# Patient Record
Sex: Female | Born: 1960 | Hispanic: No | Marital: Single | Smoking: Former smoker
Health system: Southern US, Community
[De-identification: ages and names within clinical notes are randomized; demographics above are authoritative.]

## PROBLEM LIST (undated history)

## (undated) DIAGNOSIS — C801 Malignant (primary) neoplasm, unspecified: Secondary | ICD-10-CM

## (undated) DIAGNOSIS — F329 Major depressive disorder, single episode, unspecified: Secondary | ICD-10-CM

## (undated) DIAGNOSIS — J45909 Unspecified asthma, uncomplicated: Secondary | ICD-10-CM

## (undated) DIAGNOSIS — F32A Depression, unspecified: Secondary | ICD-10-CM

## (undated) DIAGNOSIS — F319 Bipolar disorder, unspecified: Secondary | ICD-10-CM

## (undated) HISTORY — PX: OVARIAN CYST REMOVAL: SHX89

## (undated) HISTORY — PX: KNEE ARTHROSCOPY W/ ACL RECONSTRUCTION: SHX1858

---

## 1998-04-29 ENCOUNTER — Other Ambulatory Visit: Admission: RE | Admit: 1998-04-29 | Discharge: 1998-04-29 | Payer: Self-pay | Admitting: Obstetrics and Gynecology

## 1999-02-17 ENCOUNTER — Other Ambulatory Visit: Admission: RE | Admit: 1999-02-17 | Discharge: 1999-02-17 | Payer: Self-pay | Admitting: Obstetrics and Gynecology

## 2016-03-28 ENCOUNTER — Encounter: Payer: Self-pay | Admitting: Hematology and Oncology

## 2016-03-28 ENCOUNTER — Telehealth: Payer: Self-pay | Admitting: Hematology and Oncology

## 2016-03-28 NOTE — Telephone Encounter (Signed)
Appointment scheduled with Dr. Lindi Adie for 7/10 at 9:30 per Coralyn Mark for a second opinion. Patient agreed. Demographics verified and location given to the patient. Letter to referring.

## 2016-03-31 ENCOUNTER — Telehealth: Payer: Self-pay | Admitting: *Deleted

## 2016-03-31 NOTE — Telephone Encounter (Signed)
Received appt date/time from Andrea.  Placed a note for an intake form to be given to the pt at time of check in. 

## 2016-04-04 ENCOUNTER — Encounter: Payer: Self-pay | Admitting: Hematology and Oncology

## 2016-04-04 ENCOUNTER — Other Ambulatory Visit: Payer: Self-pay | Admitting: Hematology and Oncology

## 2016-04-04 ENCOUNTER — Ambulatory Visit
Admission: RE | Admit: 2016-04-04 | Discharge: 2016-04-04 | Disposition: A | Discharge status: Home / Self Care | Payer: PRIVATE HEALTH INSURANCE | Source: Ambulatory Visit | Attending: Hematology and Oncology | Admitting: Hematology and Oncology

## 2016-04-04 ENCOUNTER — Telehealth: Payer: Self-pay | Admitting: Hematology and Oncology

## 2016-04-04 ENCOUNTER — Ambulatory Visit (HOSPITAL_BASED_OUTPATIENT_CLINIC_OR_DEPARTMENT_OTHER): Payer: PRIVATE HEALTH INSURANCE | Admitting: Hematology and Oncology

## 2016-04-04 VITALS — BP 113/74 | HR 72 | Temp 98.2°F | Resp 18 | Ht 74.5 in | Wt 133.9 lb

## 2016-04-04 DIAGNOSIS — N6320 Unspecified lump in the left breast, unspecified quadrant: Secondary | ICD-10-CM

## 2016-04-04 DIAGNOSIS — C50212 Malignant neoplasm of upper-inner quadrant of left female breast: Secondary | ICD-10-CM | POA: Diagnosis not present

## 2016-04-04 DIAGNOSIS — C773 Secondary and unspecified malignant neoplasm of axilla and upper limb lymph nodes: Secondary | ICD-10-CM

## 2016-04-04 NOTE — Assessment & Plan Note (Signed)
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 After reviewing the pathology in detail, we proceeded to discuss the different treatment options between surgery, radiation, chemotherapy, antiestrogen therapies.  Recommendation: 1. Neoadjuvant chemotherapy with TCH Perjeta 6 cycles followed by Herceptin maintenance for 1 year 2. Followed by breast conserving surgery+ AXLND vs clinical trial ALLIANCE [id number] (Axlnd vs Sln alone depending on response to treatment) 3. Followed by adjuvant radiation therapy 4. Followed by adjuvant antiestrogen therapy  Chemotherapy Counseling: I discussed the risks and benefits of chemotherapy including the risks of nausea/ vomiting, risk of infection from low WBC count, fatigue due to chemo or anemia, bruising or bleeding due to low platelets, mouth sores, loss/ change in taste and decreased appetite. Liver and kidney function will be monitored through out chemotherapy as abnormalities in liver and kidney function may be a side effect of treatment. Cardiac dysfunction due to Herceptin and Perjeta were discussed in detail. Risk of permanent bone marrow dysfunction due to chemo were also discussed.  Plan: 1. Port placement 2. Echocardiogram 3. Chemotherapy class 4. Breast MRI

## 2016-04-04 NOTE — Progress Notes (Signed)
[facility] CONSULT NOTE  No care team member to display  CHIEF COMPLAINTS/PURPOSE OF CONSULTATION:  Newly diagnosed breast cancer  HISTORY OF PRESENTING ILLNESS:  Marie Chang 55 y.o. female is here because of recent diagnosis of left breast cancer. Patient had a screening mammogram for detection abnormalities in the left breast. She subsequently underwent diagnostic mammograms and ultrasound and ultrasound-guided biopsy. 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 She also had enlarged axillary lymph nodes and lymph node biopsy was performed which revealed invasive ductal carcinoma. She decided to get a second opinion regarding her diagnosis and is here accompanied by her mother to discuss the results. She reports no major pain or discomfort after the biopsies. One of her main issues was that she is not confident about the biopsies of her performed and would like the investigations to be repeated along with diagnostic biopsies.  I reviewed her records extensively and collaborated the history with the patient.  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   MEDICAL HISTORY:  Depression, anxiety SURGICAL HISTORY: Cesarean section, ovarian cyst surgery SOCIAL HISTORY: Previous smoker smoked half a pack a day for 10 years quit 10 years ago, drinks 3 glasses of wine a week, denies any recreational drug use FAMILY  HISTORY: Father had prostate cancer and maternal uncle had prostate and colon cancers ALLERGIES:  Codeine causes itching, some antibiotics causes rash on torso MEDICATIONS: Sertraline once daily, Depakote 750 mg once daily REVIEW OF SYSTEMS:   Constitutional: Denies fevers, chills or abnormal night sweats Eyes: Denies blurriness of vision, double vision or watery eyes Ears, nose, mouth, throat, and face: Denies mucositis or sore throat Respiratory: Denies cough, dyspnea or wheezes Cardiovascular: Denies palpitation, chest discomfort or lower extremity swelling Gastrointestinal:  Denies nausea, heartburn or change in bowel habits Skin: Denies abnormal skin rashes Lymphatics: Denies new lymphadenopathy or easy bruising Neurological:Denies numbness, tingling or new weaknesses Behavioral/Psych: Anxious  Breast:  Denies any palpable lumps or discharge All other systems were reviewed with the patient and are negative.  PHYSICAL EXAMINATION: ECOG PERFORMANCE STATUS: 1 - Symptomatic but completely ambulatory  Filed Vitals:   04/04/16 0939  BP: 113/74  Pulse: 72  Temp: 98.2 F (36.8 C)  Resp: 18   Filed Weights   04/04/16 0939  Weight: 133 lb 14.4 oz (60.737 kg)    GENERAL:alert, no distress and comfortable SKIN: skin color, texture, turgor are normal, no rashes or significant lesions EYES: normal, conjunctiva are pink and non-injected, sclera clear OROPHARYNX:no exudate, no erythema and lips, buccal mucosa, and tongue normal  NECK: supple, thyroid normal size, non-tender, without nodularity LYMPH:  no palpable lymphadenopathy in the cervical, axillary or inguinal LUNGS: clear to auscultation and percussion with normal breathing effort HEART: regular rate & rhythm and no murmurs and no lower extremity edema ABDOMEN:abdomen soft, non-tender and normal bowel sounds Musculoskeletal:no cyanosis of digits and no clubbing  PSYCH: alert & oriented x 3 with fluent speech NEURO:  no focal  motor/sensory deficits Breast exam: No palpable lumps in the breasts. There are palpable axillary lymph nodes.  LABORATORY DATA:  RADIOGRAPHIC STUDIES: I have personally reviewed the radiological reports and agreed with the findings in the report.  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 After reviewing the pathology in detail, we proceeded to discuss the different treatment options between surgery, radiation, chemotherapy, antiestrogen therapies.  Recommendation: 1. Neoadjuvant chemotherapy with TCH Perjeta 6 cycles followed by Herceptin maintenance for 1 year 2. Followed by breast conserving surgery+ AXLND vs clinical trial ALLIANCE [id number] (Axlnd vs Sln alone depending on response to treatment) 3. Followed by adjuvant radiation therapy 4. Followed by adjuvant antiestrogen therapy  Chemotherapy Counseling: I discussed the risks and benefits of chemotherapy including the risks of nausea/ vomiting, risk of infection from low WBC count, fatigue due to chemo or anemia, bruising or bleeding due to low platelets, mouth sores, loss/ change in taste and decreased appetite. Liver and kidney function will be monitored through out chemotherapy as abnormalities in liver and kidney function may be a side effect of treatment.  Cardiac dysfunction due to Herceptin and Perjeta were discussed in detail. Risk of permanent bone marrow dysfunction due to chemo were also discussed.  DISCUSSION: Patient is not convinced with the biopsy or imaging results that she had already performed. I discussed with her that we can have our pathologist review the pathology report. However patient is only interested in repeat imaging and reviewed biopsying her breast lesions. I discussed with her that insurance may or may not authorize this. I arranged consultation with our breast radiologist at the breast center so that they can discuss these issues with her. After consultation they agreed to perform ultrasounds and biopsies.  If her triple-positive disease diagnosis is confirmed, based upon multiple positive lymph nodes, I recommend the above treatment plan to be implemented. If she were to agree, I'll plan to do the following  Plan: 1. Port placement 2. Echocardiogram 3. Chemotherapy class 4. Breast MRI (to evaluate extent of disease and to plan any additional biopsies) 5. CT chest abdomen pelvis and bone scan because of multiple enlarged lymph nodes in her complaints of gastric distention and bloating.  All questions were answered. The patient knows to call the clinic with any problems, questions or concerns.    Rulon Eisenmenger, MD 04/04/2016

## 2016-04-04 NOTE — Telephone Encounter (Signed)
appt made and avs printed °

## 2016-04-05 ENCOUNTER — Other Ambulatory Visit: Payer: Self-pay

## 2016-04-05 DIAGNOSIS — C50212 Malignant neoplasm of upper-inner quadrant of left female breast: Secondary | ICD-10-CM

## 2016-04-05 NOTE — Addendum Note (Signed)
Addended by: Cheree Ditto on: 04/05/2016 03:14 PM   Modules accepted: Medications

## 2016-04-05 NOTE — Progress Notes (Signed)
Received VM from pt's mother requesting date and time of scheduled MRI as discussed in MD appt on 04/04/16.  Order entered per Dr. Lindi Adie and GI notified of need of appointment.  GI scheduler stated she would contact pt with time of appointment on 04/12/16.  Called pt's mother back in regards to this information.  No further questions or concerns at time of call.

## 2016-04-06 ENCOUNTER — Telehealth: Payer: Self-pay

## 2016-04-06 NOTE — Telephone Encounter (Signed)
Received VM from pt's mother with several questions regarding daughter's plan of care moving forward.  Returned call and discussed.  I explained to Jana Half that Sharica is scheduled for an MRI on 7/18 and then will have a follow up appointment to discuss all details of plan on 7/19 with Dr. Lindi Adie.  Jana Half wished to confirm order of events moving forward in regards to her daughter's treatment.  I discussed that Aniylah would start with the MRI, followed by chemotherapy, followed by surgery, and then would have radiation therapy.  Jana Half confused as she thought radiation would be first and also because Shatarra received call from surgeon's office.  I reiterated this was a consultation in regards to the referral placed to Dr. Cristal Generous office.  I reiterated that Dr. Lindi Adie would have details and plan moving forward at follow up appointment next week once MRI was obtained.  Reviewed upcoming appointment dates and times and Jana Half without further questions or concerns at time of call.

## 2016-04-11 ENCOUNTER — Telehealth: Payer: Self-pay

## 2016-04-11 NOTE — Telephone Encounter (Signed)
This RN returned call to pt mother re: MRI.  I advised her that MRI was declined by insurance because [facility] is out of network and to be covered she would need to go to the [facility] facility in [place], [place].  She stated pt would not return to the [facility] facility, and it is fine, she will pay for the scan for her daughter out of pocket.

## 2016-04-12 ENCOUNTER — Ambulatory Visit
Admission: RE | Admit: 2016-04-12 | Discharge: 2016-04-12 | Disposition: A | Discharge status: Home / Self Care | Payer: PRIVATE HEALTH INSURANCE | Source: Ambulatory Visit | Attending: Hematology and Oncology | Admitting: Hematology and Oncology

## 2016-04-12 DIAGNOSIS — C50212 Malignant neoplasm of upper-inner quadrant of left female breast: Secondary | ICD-10-CM

## 2016-04-12 MED ORDER — GADOBENATE DIMEGLUMINE 529 MG/ML IV SOLN
12.0000 mL | Freq: Once | INTRAVENOUS | Status: AC | PRN
  Administered 2016-04-12: 12 mL via INTRAVENOUS

## 2016-04-13 ENCOUNTER — Encounter: Payer: Self-pay | Admitting: Hematology and Oncology

## 2016-04-13 ENCOUNTER — Ambulatory Visit (HOSPITAL_BASED_OUTPATIENT_CLINIC_OR_DEPARTMENT_OTHER): Payer: PRIVATE HEALTH INSURANCE | Admitting: Hematology and Oncology

## 2016-04-13 ENCOUNTER — Other Ambulatory Visit: Payer: Self-pay | Admitting: *Deleted

## 2016-04-13 VITALS — BP 138/80 | HR 83 | Temp 98.4°F | Resp 18 | Wt 135.2 lb

## 2016-04-13 DIAGNOSIS — C50212 Malignant neoplasm of upper-inner quadrant of left female breast: Secondary | ICD-10-CM

## 2016-04-13 DIAGNOSIS — C773 Secondary and unspecified malignant neoplasm of axilla and upper limb lymph nodes: Secondary | ICD-10-CM | POA: Diagnosis not present

## 2016-04-13 DIAGNOSIS — Z170 Estrogen receptor positive status [ER+]: Secondary | ICD-10-CM

## 2016-04-13 NOTE — Assessment & Plan Note (Signed)
Left breast biopsy 04/04/2016: 10:00 6 cmfn: IDC grade 3, ER 90%, PR 0%, Ki-67 50% HER-2 + ratio 3.16, copy #12.25; Biopsy 10:00, 8 CMFN: IDC grade 3 ER 90%, PR 0%, Ki-67 40%, HER-2 positive ratio 2.79, 12.75 average copy numbers percent; left axillary lymph node positive for metastatic cancer, ER 95%, PR 0%, Ki-67 40%, HER-2 positive ratio 3.16, average copy #12.25  Breast MRI 04/12/2016: Multifocal left breast cancer, 2 masses and an MB measures 4 cm; right breast oval enhancing nodule 7 mm, multiple left axillary lymph nodes largest 3.9 cm, T2 N1 stage IIB clinical stage.  Pathology review: I discussed the radiology of the breast MRI and the pathology with the patient great detail. I am concerned about multifocality of the left breast cancer as well as the new lesions in the right breast. I'm also very concerned about the bulky axillary lymphadenopathy.  Recommendation based on multidisciplinary tumor board: 1. Right breast biopsy 2. Biopsy of the posterior extent of the left breast mass (which the MRI suggests as DCIS) 3. Await the results of CT scans and bone scans for staging 4. Neoadjuvant chemotherapy with [place] Perjeta 5. Followed by surgery +/_ AXLND 6. Followed by adjuvant radiation 7. Followed by adjuvant antiestrogen therapy.  Return to clinic after scans and biopsies to start neoadjuvant therapy.

## 2016-04-13 NOTE — Progress Notes (Signed)
No care team member to display  SUMMARY OF ONCOLOGIC HISTORY:   Breast cancer of upper-inner quadrant of left female breast ([place])   03/14/2016 Mammogram Left breast mammogram and ultrasound revealed 2 masses at 10:00 position 16 mm, 12 mm in multiple enlarged axillary lymph nodes with cortical thickening, T1cN1 (Stage 2A clinical stage)   04/04/2016 Initial Diagnosis Left breast bx 10:00 6 cmfn: IDC grade 3, ER 90%, PR 0%, Ki-67 50% HER-2 + ratio 3.16, copy #12.25; Biopsy 10:00, 8 CMFN: IDC grade 3 ER 90%, PR 0%, Ki-67 40%, HER-2 pos ratio 2.79, 12.75 copy#; left ax LN: Br ca ER 95% PR 0%, Ki-67 40%, HER-2 Pos 3.16   04/12/2016 Breast MRI Multifocal left breast cancer, 2 masses and  non-mass enhancement measures 4 cm; right breast oval enhancing nodule 7 mm, multiple left axillary lymph nodes largest 3.9 cm, T2 N1 stage IIB clinical stage    CHIEF COMPLIANT:  Follow-up to discuss the recent biopsies and breast MRI  INTERVAL HISTORY: Marie Chang is a  55 year old with above-mentioned history of left breast cancer multifocal disease  With bulky left axillary lymphadenopathy. She underwent repeat testing with mammogram ultrasound and biopsy that confirmed the left breast invasive ductal carcinoma.The posterior mass was also confirmed to be invasive ductal carcinoma. Left axillary lymph node biopsy is also proven to be breast cancer.The tumors are ER positive PR negative and HER-2 positive. She underwent a breast MRI on 04/12/2016 which revealed 2 masses.In addition to the 2 masses in the left breast there was a suspicious nodule measuring 7 mm in the right breast. She is here to discuss the follow-up treatment plan.  REVIEW OF SYSTEMS:   Constitutional: Denies fevers, chills or abnormal weight loss Eyes: Denies blurriness of vision Ears, nose, mouth, throat, and face: Denies mucositis or sore throat Respiratory: Denies cough, dyspnea or wheezes Cardiovascular: Denies palpitation, chest  discomfort Gastrointestinal:  Denies nausea, heartburn or change in bowel habits Skin: Denies abnormal skin rashes Lymphatics: Denies new lymphadenopathy or easy bruising Neurological:Denies numbness, tingling or new weaknesses Behavioral/Psych:  Very anxious Extremities: No lower extremity edema Breast: palpable lumps in nodules in the left breast All other systems were reviewed with the patient and are negative.  I have reviewed the past medical history, past surgical history, social history and family history with the patient and they are unchanged from previous note.  ALLERGIES:  is allergic to abilify and codeine.  MEDICATIONS:  Current Outpatient Prescriptions  Medication Sig Dispense Refill  . divalproex (DEPAKOTE ER) 500 MG 24 hr tablet Take 500 mg by mouth daily.  5  . divalproex (DEPAKOTE) 250 MG DR tablet TAKE 1 TAB BY MOUTH DAILY WITH THE '500MG'$  TABLET, AND TAKE WITH FOOD  1  . sertraline (ZOLOFT) 100 MG tablet Take 100 mg by mouth daily.  1   No current facility-administered medications for this visit.    PHYSICAL EXAMINATION: ECOG PERFORMANCE STATUS: 1 - Symptomatic but completely ambulatory  Filed Vitals:   04/13/16 0911  BP: 138/80  Pulse: 83  Temp: 98.4 F (36.9 C)  Resp: 18   Filed Weights   04/13/16 0911  Weight: 135 lb 3.2 oz (61.326 kg)    GENERAL:alert, no distress and comfortable SKIN: skin color, texture, turgor are normal, no rashes or significant lesions EYES: normal, Conjunctiva are pink and non-injected, sclera clear OROPHARYNX:no exudate, no erythema and lips, buccal mucosa, and tongue normal  NECK: supple, thyroid normal size, non-tender, without nodularity LYMPH:  no palpable lymphadenopathy  in the cervical, axillary or inguinal LUNGS: clear to auscultation and percussion with normal breathing effort HEART: regular rate & rhythm and no murmurs and no lower extremity edema ABDOMEN:abdomen soft, non-tender and normal bowel  sounds MUSCULOSKELETAL:no cyanosis of digits and no clubbing  NEURO: alert & oriented x 3 with fluent speech, no focal motor/sensory deficits EXTREMITIES: No lower extremity edema ASSESSMENT & PLAN:  Breast cancer of upper-inner quadrant of left female breast ([facility]) Left breast biopsy 04/04/2016: 10:00 6 cmfn: IDC grade 3, ER 90%, PR 0%, Ki-67 50% HER-2 + ratio 3.16, copy #12.25; Biopsy 10:00, 8 CMFN: IDC grade 3 ER 90%, PR 0%, Ki-67 40%, HER-2 positive ratio 2.79, 12.75 average copy numbers percent; left axillary lymph node positive for metastatic cancer, ER 95%, PR 0%, Ki-67 40%, HER-2 positive ratio 3.16, average copy #12.25  Breast MRI 04/12/2016: Multifocal left breast cancer, 2 masses and an MB measures 4 cm; right breast oval enhancing nodule 7 mm, multiple left axillary lymph nodes largest 3.9 cm, T2 N1 stage IIB clinical stage.  Pathology review: I discussed the radiology of the breast MRI and the pathology with the patient great detail. I am concerned about multifocality of the left breast cancer as well as the new lesions in the right breast. I'm also very concerned about the bulky axillary lymphadenopathy.  Recommendation based on multidisciplinary tumor board: 1. Right breast biopsy 2.Await the results of CT scans and bone scans for staging 4. Neoadjuvant chemotherapy with [place] Perjeta 5. Followed by Mastectomy +/_ AXLND 6. Followed by adjuvant radiation 7. Followed by adjuvant antiestrogen therapy.   patient would like to receive the treatment in [place] however she lives 4 hours away. I suggested that she received this chemotherapy closer to home.She provided me the contact information for Dr. Luna Glasgow  With [facility]. I would like to make her an appointment with Dr. Linwood Dibbles to initiate the treatment.  However she would like to undergo any interventions or procedures only in [place]. I will request the CT scans,port placement, echocardiogram to  be done here. Ideally all of this treatment could have been done in [place]. Patient is extremely reluctant to follow this Plan instead she is willing to go through treatments closer to home but want to keep the scan and other appointments in [place].  I will call Dr. Rodney Booze and Dr. Linwood Dibbles  Regarding this treatment plan. She will follow with Dr. Linwood Dibbles and could even undergo the breast surgery closer to home. However if she intends to come to [place], we are happy to assist her.  No orders of the defined types were placed in this encounter.   The patient has a good understanding of the overall plan. she agrees with it. she will call with any problems that may develop before the next visit here.   Rulon Eisenmenger, MD 04/13/2016

## 2016-04-14 ENCOUNTER — Other Ambulatory Visit: Payer: Self-pay | Admitting: Hematology and Oncology

## 2016-04-14 DIAGNOSIS — C50212 Malignant neoplasm of upper-inner quadrant of left female breast: Secondary | ICD-10-CM

## 2016-04-15 ENCOUNTER — Other Ambulatory Visit: Payer: Self-pay | Admitting: Radiology

## 2016-04-15 ENCOUNTER — Ambulatory Visit (HOSPITAL_COMMUNITY): Payer: PRIVATE HEALTH INSURANCE

## 2016-04-15 ENCOUNTER — Other Ambulatory Visit: Payer: Self-pay | Admitting: General Surgery

## 2016-04-18 ENCOUNTER — Encounter (HOSPITAL_COMMUNITY): Payer: Self-pay

## 2016-04-18 ENCOUNTER — Ambulatory Visit (HOSPITAL_COMMUNITY)
Admission: RE | Admit: 2016-04-18 | Discharge: 2016-04-18 | Disposition: A | Discharge status: Home / Self Care | Payer: No Typology Code available for payment source | Source: Ambulatory Visit | Attending: Hematology and Oncology | Admitting: Hematology and Oncology

## 2016-04-18 ENCOUNTER — Other Ambulatory Visit: Payer: Self-pay | Admitting: Hematology and Oncology

## 2016-04-18 DIAGNOSIS — J45909 Unspecified asthma, uncomplicated: Secondary | ICD-10-CM | POA: Diagnosis not present

## 2016-04-18 DIAGNOSIS — Z885 Allergy status to narcotic agent status: Secondary | ICD-10-CM | POA: Diagnosis not present

## 2016-04-18 DIAGNOSIS — Z87891 Personal history of nicotine dependence: Secondary | ICD-10-CM | POA: Insufficient documentation

## 2016-04-18 DIAGNOSIS — C50212 Malignant neoplasm of upper-inner quadrant of left female breast: Secondary | ICD-10-CM | POA: Diagnosis not present

## 2016-04-18 DIAGNOSIS — C50912 Malignant neoplasm of unspecified site of left female breast: Secondary | ICD-10-CM | POA: Diagnosis present

## 2016-04-18 DIAGNOSIS — C773 Secondary and unspecified malignant neoplasm of axilla and upper limb lymph nodes: Secondary | ICD-10-CM | POA: Insufficient documentation

## 2016-04-18 DIAGNOSIS — F319 Bipolar disorder, unspecified: Secondary | ICD-10-CM | POA: Insufficient documentation

## 2016-04-18 HISTORY — DX: Major depressive disorder, single episode, unspecified: F32.9

## 2016-04-18 HISTORY — PX: IR GENERIC HISTORICAL: IMG1180011

## 2016-04-18 HISTORY — DX: Malignant (primary) neoplasm, unspecified: C80.1

## 2016-04-18 HISTORY — DX: Bipolar disorder, unspecified: F31.9

## 2016-04-18 HISTORY — DX: Depression, unspecified: F32.A

## 2016-04-18 HISTORY — DX: Unspecified asthma, uncomplicated: J45.909

## 2016-04-18 LAB — CBC
HCT: 46.4 % — ABNORMAL HIGH (ref 36.0–46.0)
Hemoglobin: 15 g/dL (ref 12.0–15.0)
MCH: 30.2 pg (ref 26.0–34.0)
MCHC: 32.3 g/dL (ref 30.0–36.0)
MCV: 93.5 fL (ref 78.0–100.0)
Platelets: 275 10*3/uL (ref 150–400)
RBC: 4.96 MIL/uL (ref 3.87–5.11)
RDW: 13.4 % (ref 11.5–15.5)
WBC: 9.6 10*3/uL (ref 4.0–10.5)

## 2016-04-18 LAB — BASIC METABOLIC PANEL
Anion gap: 7 (ref 5–15)
BUN: 13 mg/dL (ref 6–20)
CHLORIDE: 104 mmol/L (ref 101–111)
CO2: 27 mmol/L (ref 22–32)
Calcium: 8.8 mg/dL — ABNORMAL LOW (ref 8.9–10.3)
Creatinine, Ser: 0.88 mg/dL (ref 0.44–1.00)
GFR calc non Af Amer: >60 mL/min (ref >60)
Glucose, Bld: 103 mg/dL — ABNORMAL HIGH (ref 65–99)
POTASSIUM: 4 mmol/L (ref 3.5–5.1)
SODIUM: 138 mmol/L (ref 135–145)

## 2016-04-18 LAB — PROTIME-INR
INR: 1.07 (ref 0.00–1.49)
PROTHROMBIN TIME: 14.1 s (ref 11.6–15.2)

## 2016-04-18 LAB — HCG, SERUM, QUALITATIVE: Preg, Serum: NEGATIVE

## 2016-04-18 LAB — APTT: aPTT: 25 seconds (ref 24–37)

## 2016-04-18 MED ORDER — MIDAZOLAM HCL 2 MG/2ML IJ SOLN
INTRAMUSCULAR | Status: AC | PRN
  Administered 2016-04-18 (×3): 1 mg via INTRAVENOUS

## 2016-04-18 MED ORDER — FENTANYL CITRATE (PF) 100 MCG/2ML IJ SOLN
INTRAMUSCULAR | Status: AC
  Filled 2016-04-18: qty 2

## 2016-04-18 MED ORDER — SODIUM CHLORIDE 0.9 % IV SOLN
Freq: Once | INTRAVENOUS | Status: AC
  Administered 2016-04-18: 12:00:00 via INTRAVENOUS

## 2016-04-18 MED ORDER — MIDAZOLAM HCL 2 MG/2ML IJ SOLN
INTRAMUSCULAR | Status: AC
  Filled 2016-04-18: qty 2

## 2016-04-18 MED ORDER — LIDOCAINE HCL 1 % IJ SOLN
INTRAMUSCULAR | Status: AC
  Filled 2016-04-18: qty 20

## 2016-04-18 MED ORDER — CEFAZOLIN SODIUM-DEXTROSE 2-4 GM/100ML-% IV SOLN
2.0000 g | INTRAVENOUS | Status: AC
  Filled 2016-04-18: qty 100

## 2016-04-18 MED ORDER — FENTANYL CITRATE (PF) 100 MCG/2ML IJ SOLN
INTRAMUSCULAR | Status: AC | PRN
  Administered 2016-04-18: 25 ug via INTRAVENOUS
  Administered 2016-04-18 (×2): 50 ug via INTRAVENOUS

## 2016-04-18 NOTE — Consult Note (Signed)
Chief Complaint: Patient was seen in consultation today for Port-A-Cath placement  Referring Physician(s): Gudena,Vinay  Supervising Physician: Daryll Brod  Patient Status: Outpatient  History of Present Illness: Marie Chang is a 55 y.o. female with history of recently diagnosed left breast cancer who presents today for Port-A-Cath placement for chemotherapy.                                                                                                                                                                   Past Medical History:  Diagnosis Date  . Asthma   . Bipolar disorder ([place])   . Cancer [facility])    breast  . Depression     Past Surgical History:  Procedure Laterality Date  . CESAREAN SECTION    . KNEE ARTHROSCOPY W/ ACL RECONSTRUCTION Left   . OVARIAN CYST REMOVAL Bilateral     Allergies: Abilify [aripiprazole] and Codeine  Medications: Prior to Admission medications   Medication Sig Start Date End Date Taking? Authorizing Provider  Albuterol Sulfate 108 (90 Base) MCG/ACT AEPB Inhale 2 puffs into the lungs as needed.   Yes Historical Provider, MD  divalproex (DEPAKOTE ER) 500 MG 24 hr tablet Take 500 mg by mouth daily. 02/11/16   Historical Provider, MD  divalproex (DEPAKOTE) 250 MG DR tablet TAKE 1 TAB BY MOUTH DAILY WITH THE 500MG TABLET, AND TAKE WITH FOOD 02/11/16   Historical Provider, MD  sertraline (ZOLOFT) 100 MG tablet Take 100 mg by mouth daily. 02/22/16   Historical Provider, MD     History reviewed. No pertinent family history.  Social History   Social History  . Marital status: Single    Spouse name: N/A  . Number of children: N/A  . Years of education: N/A   Social History Main Topics  . Smoking status: Former Smoker    Quit date: 04/18/2006  . Smokeless tobacco: Former Systems developer  . Alcohol use Yes     Comment: social  . Drug use: No  . Sexual activity: Not Asked   Other Topics Concern  . None   Social History Narrative  .  None      Review of Systems denies fever, headache, chest pain, dyspnea, cough, abdominal/back pain, nausea, vomiting or abnormal bleeding.  Vital Signs: BP 114/69 (BP Location: Left Arm)   Pulse 69   Temp 98.3 F (36.8 C) (Oral)   Resp 18   Ht 6' 5.2" (1.961 m)   Wt 131 lb 12.8 oz (59.8 kg)   LMP 04/11/2016 (Exact Date)   SpO2 100%   BMI 15.55 kg/m   Physical Exam awake, alert. Chest with slightly diminished breath sounds left base, right clear. Heart with regular rate and rhythm. Abdomen soft, positive bowel sounds, nontender. Lower  extremities with no edema.  Mallampati Score:     Imaging: Mr Breast Bilateral W Wo Contrast  Result Date: 04/12/2016 CLINICAL DATA:  55 year old with recent diagnosis of multifocal breast cancer involving the upper inner quadrant of the left breast and a biopsy-proven metastatic left axillary lymph node. The diagnostic mammogram revealed nodularity posterior and medial to the biopsy proven malignancies in the upper inner quadrant of the left breast, though there was no convincing sonographic correlate for this nodularity. MRI is requested to evaluate extent of disease prior to treatment. LABS:  Not applicable. EXAM: BILATERAL BREAST MRI WITH AND WITHOUT CONTRAST TECHNIQUE: Multiplanar, multisequence MR images of both breasts were obtained prior to and following the intravenous administration of 12 ml of MultiHance. THREE-DIMENSIONAL MR IMAGE RENDERING ON INDEPENDENT WORKSTATION: Three-dimensional MR images were rendered by post-processing of the original MR data on an independent workstation. The three-dimensional MR images were interpreted, and findings are reported in the following complete MRI report for this study. Three dimensional images were evaluated at the independent DynaCad workstation. COMPARISON:  Left mammogram and left breast ultrasound 04/04/2016. Bilateral mammography 01/25/2016, 07/29/2009, 07/28/2009 and left breast ultrasound  03/16/2016, 03/14/2016 from [facility] in [place], [place]. FINDINGS: Breast composition: c. Heterogeneous fibroglandular tissue. Background parenchymal enhancement: Moderate. Right breast: Circumscribed oval-shaped enhancing nodule directly behind the nipple measuring 7 mm, demonstrating type 1 progressive enhancement kinetics. No pathologic enhancement elsewhere. 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 These correspond to the masses identified on mammography and ultrasound, previously biopsied. Nodular non mass enhancement in the upper inner quadrant at posterior depth, superior and medial to the more superior mass described above, measuring in total approximately 1.9 x 2.9 x 3.9 cm, demonstrating type 3 washout kinetics. No pathologic enhancement elsewhere. Lymph nodes: Multiple pathologically enlarged left axillary lymph nodes, the largest measuring approximately 2.1 x 4.1 x 3.9 cm, within which there is blooming artifact from the biopsy clip, biopsy proven metastatic disease. No pathologic lymphadenopathy elsewhere. Ancillary findings:  None. IMPRESSION: 1. Multifocal cancer involving the upper inner quadrant of the left breast with 2 adjacent enhancing masses which have previously been biopsied. 2. Nodular non mass enhancement superior and medial to the most superior of the previously biopsied masses in the upper inner quadrant of the left breast spanning approximately 4 cm, highly suspicious for DCIS. 3. No pathologic enhancement elsewhere in the left breast, so the disease appears  confined to the upper inner quadrant. 4. Enhancing 7 mm circumscribed nodule directly behind the right nipple. There is no mammographic correlate on the screening mammogram from May, 2017. 5. Multiple pathologic left axillary lymph nodes. No pathologic lymphadenopathy elsewhere. RECOMMENDATION: 1. MRI guided biopsy of the nodular non mass enhancement in the upper inner quadrant of the left breast at posterior depth. This enhancement is located superior and medial to the biopsy-proven multifocal cancer. 2. MRI guided biopsy of the enhancing nodule directly behind the right nipple. BI-RADS CATEGORY  4: Suspicious. Electronically Signed   By: Evangeline Dakin M.D.   On: 04/12/2016 11:24   US Breast Ltd Uni Left Inc Axilla  Result Date: 04/04/2016 CLINICAL DATA:  The patient recently  had 3 biopsies on the left. 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 A metastatic node in the left axilla was also biopsied. The patient wishes for a second opinion, repeat of the mammogram and ultrasound, and repeat of all 3 biopsies. I reviewed the original set of imaging with the patient and advised against repeating the mammogram, ultrasound, and 3 biopsies. However, understanding the second opinion, she wished to proceed with repeat imaging and biopsies. EXAM: 2D DIGITAL DIAGNOSTIC LEFT MAMMOGRAM WITH CAD AND ADJUNCT TOMO ULTRASOUND LEFT BREAST COMPARISON:  Previous exam(s). ACR Breast Density Category c: The breast tissue is heterogeneously dense, which may obscure small masses. FINDINGS: There are 2 masses in the medial left breast containing biopsy clips. The more inferior of the 2 masses measures 15 by 11 mm and the more superior of the 2 masses measures 18 x 12 mm. Taken in their entirety, the 2 masses span 3.9 x 3 cm. Incidentally, there are multiple small nodules posterior an slightly medial to the recently biopsied masses. Taking the small nodules into account, the total  span of abnormality is 4.8 cm. Also, a few calcifications were noted within the biopsied masses, better seen on the previous study. No other interval changes. Calcifications in the lateral superior left breast are unchanged since 2010. Mammographic images were processed with CAD. On physical exam, no suspicious lumps are identified. Targeted ultrasound is performed, showing multiple abnormal lymph nodes in the left axilla. At least 4 abnormal lymph nodes are seen. The largest lymph node demonstrates a cortical thickness of 15.5 mm. There is a mass at 10 o'clock, 6 cm from the nipple measuring 13 x 12 x 10 mm. There is another irregular mass at 10 o'clock, 8 cm from the nipple measuring 15 x 6 x 12 mm. There is also a background of multiple small hypoechoic and anechoic nodularity in the breasts, likely fibrocystic change. A discrete satellite lesion is not seen sonographically. IMPRESSION: The biopsy proven malignancy in the left breast presents as 2 irregular masses mammographically and sonographically. Several small nodules are seen posterior and slightly medial to the biopsy proven malignancy, not seen on previous studies. The total span of biopsy proven malignancy is 3.9 x 3 cm. If the adjacent nodularity were to represent small satellite lesions, the total extent of disease would be at least 4.8 cm. RECOMMENDATION: I recommended a breast MRI to the patient to evaluate extent of disease and to further evaluate the nodularity along the posterior medial aspect of the biopsy proven malignancy. Additionally, the MRI would be helpful given the known DCIS to evaluate extent of disease. However, the patient wishes to first re-biopsy the 2 previously biopsied masses in the medial left breast and the abnormal lymph node in the left axilla. She is already scheduled for a breast MRI. She is aware that today's biopsies are, in my opinion, unlikely to result in a different diagnosis and additional biopsies may very well be  needed after the MRI to evaluate extent of disease. She is also aware her insurance may not pay for the repeat imaging and biopsies. We did discuss the potential extensive cost associated with today's repeat imaging and biopsies. If the MRI demonstrates no further suspicious abnormalities than the patient's known malignancy, a stereotactic biopsy should be considered for the nodularity along the posterior medial aspect of the known malignancy if lumpectomy is being considered. I have discussed the findings and recommendations with the patient. Results were also provided in  writing at the conclusion of the visit. If applicable, a reminder letter will be sent to the patient regarding the next appointment. BI-RADS CATEGORY  6: Known biopsy-proven malignancy. Electronically Signed   By: Dorise Bullion III M.D   On: 04/04/2016 15:23   Mm Diag Breast Tomo Uni Left  Addendum Date: 04/04/2016   ADDENDUM REPORT: 04/04/2016 15:53 ADDENDUM: The ribbon shaped clip is actually within the mass at 10 o'clock, 8 cm from the nipple. Placement of a heart shaped clip was attempted in the mass at 10 o'clock, 6 cm from the nipple. There are 2 clips including the ribbon shaped clip within the 10 o'clock, 8 cm from the nipple mass. It is unclear whether there are 1 or 2 clips within the mass at 10 o'clock, 6 cm from the nipple. Electronically Signed   By: Dorise Bullion III M.D   On: 04/04/2016 15:53  Result Date: 04/04/2016 CLINICAL DATA:  Evaluate marker placement EXAM: DIAGNOSTIC LEFT MAMMOGRAM POST ULTRASOUND BIOPSY COMPARISON:  Previous exam(s). FINDINGS: Mammographic images were obtained following ultrasound guided biopsy of 2 medial left breast masses and a left axillary node. The [facility] is located within the left axillary node. A ribbon shaped clip is located within the mass at 10 o'clock, 6 cm from the nipple. It is unclear whether the heart shaped marker deployed in the mass at 10 o'clock, 8 cm from the nipple or  whether there are 2 superimposed clips or whether the heart shaped clip deployed an the previously seen clip is been removed. Regardless, there are clips in both biopsy proven malignancies. IMPRESSION: Biopsy marker placement as above. Final Assessment: Post Procedure Mammograms for Marker Placement Electronically Signed: By: Dorise Bullion III M.D On: 04/04/2016 15:43   Mm Diag Breast Tomo Uni Left  Result Date: 04/04/2016 CLINICAL DATA:  The patient recently had 3 biopsies on the left. 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 A metastatic node in the left axilla was also biopsied. The patient wishes for a second opinion, repeat of the mammogram and ultrasound, and repeat of all 3 biopsies. I reviewed the original set of imaging with the patient and advised against repeating the mammogram, ultrasound, and 3 biopsies. However, understanding the second opinion, she wished to proceed with repeat imaging and biopsies. EXAM: 2D DIGITAL DIAGNOSTIC LEFT MAMMOGRAM WITH CAD AND ADJUNCT TOMO ULTRASOUND LEFT BREAST COMPARISON:  Previous exam(s). ACR Breast Density Category c: The breast tissue is heterogeneously dense, which may obscure small masses. FINDINGS: There are 2 masses in the medial left breast containing biopsy clips. The more inferior of the 2 masses measures 15 by 11 mm and the more superior of the 2 masses measures 18 x 12 mm. Taken in their entirety, the 2 masses span 3.9 x 3 cm. Incidentally, there are multiple small nodules posterior an slightly medial to the recently biopsied masses. Taking the small nodules into account, the total span of abnormality is 4.8 cm. Also, a few calcifications were noted within the biopsied masses, better seen on the previous study. No other interval changes. Calcifications in the lateral superior left breast are unchanged since 2010. Mammographic images were processed with CAD. On physical exam, no suspicious lumps are  identified. Targeted ultrasound is performed, showing multiple abnormal lymph nodes in the left axilla. At least 4 abnormal lymph nodes are seen. The largest lymph node demonstrates a cortical thickness of 15.5 mm. There is a mass at 10 o'clock, 6 cm from the  nipple measuring 13 x 12 x 10 mm. There is another irregular mass at 10 o'clock, 8 cm from the nipple measuring 15 x 6 x 12 mm. There is also a background of multiple small hypoechoic and anechoic nodularity in the breasts, likely fibrocystic change. A discrete satellite lesion is not seen sonographically. IMPRESSION: The biopsy proven malignancy in the left breast presents as 2 irregular masses mammographically and sonographically. Several small nodules are seen posterior and slightly medial to the biopsy proven malignancy, not seen on previous studies. The total span of biopsy proven malignancy is 3.9 x 3 cm. If the adjacent nodularity were to represent small satellite lesions, the total extent of disease would be at least 4.8 cm. RECOMMENDATION: I recommended a breast MRI to the patient to evaluate extent of disease and to further evaluate the nodularity along the posterior medial aspect of the biopsy proven malignancy. Additionally, the MRI would be helpful given the known DCIS to evaluate extent of disease. However, the patient wishes to first re-biopsy the 2 previously biopsied masses in the medial left breast and the abnormal lymph node in the left axilla. She is already scheduled for a breast MRI. She is aware that today's biopsies are, in my opinion, unlikely to result in a different diagnosis and additional biopsies may very well be needed after the MRI to evaluate extent of disease. She is also aware her insurance may not pay for the repeat imaging and biopsies. We did discuss the potential extensive cost associated with today's repeat imaging and biopsies. If the MRI demonstrates no further suspicious abnormalities than the patient's known  malignancy, a stereotactic biopsy should be considered for the nodularity along the posterior medial aspect of the known malignancy if lumpectomy is being considered. I have discussed the findings and recommendations with the patient. Results were also provided in writing at the conclusion of the visit. If applicable, a reminder letter will be sent to the patient regarding the next appointment. BI-RADS CATEGORY  6: Known biopsy-proven malignancy. Electronically Signed   By: Dorise Bullion III M.D   On: 04/04/2016 15:23   [place] Lt Breast Bx W Loc Dev 1st Lesion Img Bx Spec US Guide  Addendum Date: 04/05/2016   ADDENDUM REPORT: 04/05/2016 15:53 ADDENDUM: Pathology revealed GRADE III INVASIVE DUCTAL CARCINOMA of the Left breast at the 10:00 o'clock location, 8 CMFN. GRADE III INVASIVE DUCTAL CARCINOMA, PREVIOUS BIOPSY SITE of the Left breast at the 10:00 o'clock location, 6 CMFN. METASTATIC CARCINOMA of the Left axillary lymph node. This was found to be concordant by Dr. Dorise Bullion. Pathology results were discussed with the patient by telephone. The patient reported doing well after the biopsies with tenderness at the sites. Post biopsy instructions and care were reviewed and questions were answered. The patient was encouraged to call [facility] for any additional concerns. Surgical consultation has been arranged with Dr. Rolm Bookbinder at [facility] on April 26, 2016. Pathology results reported by Terie Purser, RN on 04/05/2016. Electronically Signed   By: Dorise Bullion III M.D   On: 04/05/2016 15:53  Result Date: 04/05/2016 CLINICAL DATA:  Biopsy of the left breast mass at 10 o'clock, 8 cm from the nipple. EXAM: ULTRASOUND GUIDED LEFT BREAST CORE NEEDLE BIOPSY COMPARISON:  Previous exam(s). FINDINGS: I met with the patient and we discussed the procedure of ultrasound-guided biopsy, including benefits and alternatives. We discussed the high likelihood of a  successful procedure. We discussed the risks of the procedure,  including infection, bleeding, tissue injury, clip migration, and inadequate sampling. Informed written consent was given. The usual time-out protocol was performed immediately prior to the procedure. Using sterile technique and 1% Lidocaine as local anesthetic, under direct ultrasound visualization, a 12 gauge spring-loaded device was used to perform biopsy of a left breast mass at 10 o'clock, 8 cm from the nipple using a medial approach. At the conclusion of the procedure a ribbon shaped tissue marker clip was deployed into the biopsy cavity. Follow up 2 view mammogram was performed and dictated separately. IMPRESSION: Ultrasound guided biopsy of a left breast mass at 10 o'clock, 8 cm from the nipple. No apparent complications. 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 GRADE III INVASIVE DUCTAL CARCINOMA, PREVIOUS BIOPSY SITE of the Left breast at the 10:00 o'clock location, 6 CMFN. METASTATIC CARCINOMA of the Left axillary lymph node. This was found to be concordant by Dr. Dorise Bullion. Pathology results were discussed with the patient by telephone. The patient reported doing well after the biopsies with tenderness at the sites. Post biopsy instructions and care were reviewed and questions were answered. The patient was encouraged to call [facility] for any additional concerns. Surgical consultation has been arranged with Dr. Rolm Bookbinder at [facility] on April 26, 2016. Pathology results reported by Terie Purser, RN on 04/05/2016. Electronically Signed   By: Dorise Bullion III M.D   On: 04/05/2016 15:52  Result Date:  04/05/2016 CLINICAL DATA:  Ultrasound-guided biopsy of an abnormal left axillary node EXAM: ULTRASOUND GUIDED LEFT BREAST CORE NEEDLE BIOPSY COMPARISON:  Previous exam(s). FINDINGS: I met with the patient and we discussed the procedure of ultrasound-guided biopsy, including benefits and alternatives. We discussed the high likelihood of a successful procedure. We discussed the risks of the procedure, including infection, bleeding, tissue injury, clip migration, and inadequate sampling. Informed written consent was given. The usual time-out protocol was performed immediately prior to the procedure. Using sterile technique and 1% Lidocaine as local anesthetic, under direct ultrasound visualization, a 14 gauge spring-loaded device was used to perform biopsy of an abnormal left axillary node using a lateral approach. At the conclusion of the procedure a HydroMARK tissue marker clip was deployed into the biopsy cavity. Follow up 2 view mammogram was performed and dictated separately. IMPRESSION: Ultrasound guided biopsy of an abnormal left axillary node. No apparent complications. 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 GRADE III INVASIVE DUCTAL CARCINOMA, PREVIOUS BIOPSY SITE of the Left breast at the 10:00 o'clock location, 6 CMFN. METASTATIC CARCINOMA of the Left axillary lymph node. This was found to be concordant by Dr. Dorise Bullion. Pathology results were discussed with the patient by telephone. The patient reported doing well after the biopsies with tenderness at the sites. Post biopsy instructions and care were reviewed and questions were answered. The patient was encouraged to call [facility] for any  additional concerns. Surgical consultation has been arranged with Dr. Rolm Bookbinder at [facility] on April 26, 2016. Pathology results reported by Terie Purser, RN on 04/05/2016. Electronically Signed   By: Dorise Bullion III M.D   On: 04/05/2016 15:53  Result Date: 04/05/2016 CLINICAL DATA:  Ultrasound-guided biopsy of a mass in the left breast at 10 o'clock, 6 cm from the nipple EXAM: ULTRASOUND GUIDED LEFT BREAST CORE NEEDLE BIOPSY COMPARISON:  Previous exam(s). FINDINGS: I met with the patient and we discussed the procedure of ultrasound-guided biopsy, including benefits and alternatives. We discussed the high likelihood of a successful procedure. We discussed the risks of the procedure, including infection, bleeding, tissue injury, clip migration, and inadequate sampling. Informed written consent was given. The usual time-out protocol was performed immediately prior to the procedure. Using sterile technique and 1% Lidocaine as local anesthetic, under direct ultrasound visualization, a 12 gauge spring-loaded device was used to perform biopsy of a left breast mass at 10 o'clock, 6 cm from the nipple using a medial approach. At the conclusion of the procedure a heart shaped tissue marker clip was deployed into the biopsy cavity. Follow up 2 view mammogram was performed and dictated separately. IMPRESSION: Ultrasound guided biopsy of a left breast mass at 10 o'clock, 6 cm from the nipple. No apparent complications. Electronically Signed: By: Dorise Bullion III M.D On: 04/04/2016 15:56    Labs:  CBC:  Recent Labs  04/18/16 1210  WBC 9.6  HGB 15.0  HCT 46.4*  PLT 275    COAGS: No results for input(s): INR, APTT in the last 8760 hours.  BMP: No results for input(s): NA, K, CL, CO2, GLUCOSE, BUN, CALCIUM, CREATININE, GFRNONAA, GFRAA in the last 8760 hours.  Invalid input(s): CMP  LIVER FUNCTION TESTS: No results for input(s): BILITOT, AST, ALT, ALKPHOS, PROT, ALBUMIN in the  last 8760 hours.  TUMOR MARKERS: No results for input(s): AFPTM, CEA, CA199, CHROMGRNA in the last 8760 hours.  Assessment and Plan:  55 y.o. female with history of recently diagnosed left breast cancer who presents today for Port-A-Cath placement for chemotherapy. Risks and benefits discussed with the patient/mother including, but not limited to bleeding, infection, pneumothorax, or fibrin sheath development and need for additional procedures.All of the patient's questions were answered, patient is agreeable to proceed.Consent signed and in chart.                                                                                                                                                                   Thank you for this interesting consult.  I greatly enjoyed meeting Marie Chang and look forward to participating in their care.  A copy of this report was sent to the requesting provider on this date.  Electronically Signed: D. Rowe Robert 04/18/2016, 12:24 PM   I spent a total of 20 minutes  in face to face in clinical consultation, greater than 50% of which was counseling/coordinating care for port a cath placement

## 2016-04-18 NOTE — Procedures (Signed)
S/p RT IJ POWER PORT TIP SVC/RA NO COMP STABLE READY FOR USE FULL REPORT IN PACS

## 2016-04-18 NOTE — Discharge Instructions (Signed)

## 2016-04-19 ENCOUNTER — Ambulatory Visit
Admission: RE | Admit: 2016-04-19 | Discharge: 2016-04-19 | Disposition: A | Discharge status: Home / Self Care | Payer: PRIVATE HEALTH INSURANCE | Source: Ambulatory Visit | Attending: Hematology and Oncology | Admitting: Hematology and Oncology

## 2016-04-19 ENCOUNTER — Ambulatory Visit (HOSPITAL_BASED_OUTPATIENT_CLINIC_OR_DEPARTMENT_OTHER)
Admission: RE | Admit: 2016-04-19 | Discharge: 2016-04-19 | Disposition: A | Discharge status: Home / Self Care | Payer: PRIVATE HEALTH INSURANCE | Source: Ambulatory Visit | Attending: Hematology and Oncology | Admitting: Hematology and Oncology

## 2016-04-19 ENCOUNTER — Ambulatory Visit (HOSPITAL_COMMUNITY): Payer: PRIVATE HEALTH INSURANCE

## 2016-04-19 ENCOUNTER — Encounter (HOSPITAL_COMMUNITY): Payer: Self-pay

## 2016-04-19 ENCOUNTER — Encounter (HOSPITAL_COMMUNITY): Admission: RE | Admit: 2016-04-19 | Payer: PRIVATE HEALTH INSURANCE | Source: Ambulatory Visit

## 2016-04-19 ENCOUNTER — Encounter (HOSPITAL_COMMUNITY): Payer: PRIVATE HEALTH INSURANCE

## 2016-04-19 ENCOUNTER — Ambulatory Visit (HOSPITAL_COMMUNITY)
Admission: RE | Admit: 2016-04-19 | Discharge: 2016-04-19 | Disposition: A | Discharge status: Home / Self Care | Payer: PRIVATE HEALTH INSURANCE | Source: Ambulatory Visit | Attending: Hematology and Oncology | Admitting: Hematology and Oncology

## 2016-04-19 ENCOUNTER — Other Ambulatory Visit (HOSPITAL_COMMUNITY): Payer: PRIVATE HEALTH INSURANCE

## 2016-04-19 DIAGNOSIS — C770 Secondary and unspecified malignant neoplasm of lymph nodes of head, face and neck: Secondary | ICD-10-CM | POA: Insufficient documentation

## 2016-04-19 DIAGNOSIS — C773 Secondary and unspecified malignant neoplasm of axilla and upper limb lymph nodes: Secondary | ICD-10-CM | POA: Diagnosis not present

## 2016-04-19 DIAGNOSIS — K8020 Calculus of gallbladder without cholecystitis without obstruction: Secondary | ICD-10-CM | POA: Insufficient documentation

## 2016-04-19 DIAGNOSIS — N63 Unspecified lump in breast: Secondary | ICD-10-CM | POA: Insufficient documentation

## 2016-04-19 DIAGNOSIS — C50212 Malignant neoplasm of upper-inner quadrant of left female breast: Secondary | ICD-10-CM

## 2016-04-19 DIAGNOSIS — Z8249 Family history of ischemic heart disease and other diseases of the circulatory system: Secondary | ICD-10-CM | POA: Insufficient documentation

## 2016-04-19 DIAGNOSIS — E041 Nontoxic single thyroid nodule: Secondary | ICD-10-CM | POA: Insufficient documentation

## 2016-04-19 MED ORDER — IOPAMIDOL (ISOVUE-300) INJECTION 61%
INTRAVENOUS | Status: DC
Start: 2016-04-19 — End: 2016-04-20
  Filled 2016-04-19: qty 100

## 2016-04-19 MED ORDER — GADOBENATE DIMEGLUMINE 529 MG/ML IV SOLN
12.0000 mL | Freq: Once | INTRAVENOUS | Status: AC | PRN
  Administered 2016-04-19: 12 mL via INTRAVENOUS

## 2016-04-19 MED ORDER — IOPAMIDOL (ISOVUE-300) INJECTION 61%
100.0000 mL | Freq: Once | INTRAVENOUS | Status: AC | PRN
  Administered 2016-04-19: 100 mL via INTRAVENOUS

## 2016-04-19 NOTE — Progress Notes (Signed)
  Echocardiogram 2D Echocardiogram has been performed.  Tresa Res 04/19/2016, 12:21 PM

## 2016-04-20 ENCOUNTER — Encounter (HOSPITAL_COMMUNITY)
Admission: RE | Admit: 2016-04-20 | Discharge: 2016-04-20 | Disposition: A | Discharge status: Home / Self Care | Payer: PRIVATE HEALTH INSURANCE | Source: Ambulatory Visit | Attending: Hematology and Oncology | Admitting: Hematology and Oncology

## 2016-04-20 ENCOUNTER — Telehealth: Payer: Self-pay

## 2016-04-20 DIAGNOSIS — C50212 Malignant neoplasm of upper-inner quadrant of left female breast: Secondary | ICD-10-CM

## 2016-04-20 MED ORDER — TECHNETIUM TC 99M MEDRONATE IV KIT
25.0000 | PACK | Freq: Once | INTRAVENOUS | Status: AC | PRN
  Administered 2016-04-20: 25 via INTRAVENOUS

## 2016-04-20 NOTE — Telephone Encounter (Signed)
Pt provided info for oncologist as follows:   Dr. Luna Glasgow [place], [place].    Routed to Dr. Lindi Adie.   Pt requested appt to review results of all scans.  Appt made for 1215 Friday 7/28.  Pt voiced understanding.

## 2016-04-21 NOTE — Progress Notes (Signed)
All scans and reports faxed to Dr. Linwood Dibbles with [facility].

## 2016-04-22 ENCOUNTER — Ambulatory Visit (HOSPITAL_COMMUNITY): Payer: PRIVATE HEALTH INSURANCE

## 2016-04-22 ENCOUNTER — Encounter: Payer: Self-pay | Admitting: Hematology and Oncology

## 2016-04-22 ENCOUNTER — Ambulatory Visit (HOSPITAL_BASED_OUTPATIENT_CLINIC_OR_DEPARTMENT_OTHER): Payer: PRIVATE HEALTH INSURANCE | Admitting: Hematology and Oncology

## 2016-04-22 ENCOUNTER — Encounter (HOSPITAL_COMMUNITY): Payer: PRIVATE HEALTH INSURANCE

## 2016-04-22 DIAGNOSIS — C773 Secondary and unspecified malignant neoplasm of axilla and upper limb lymph nodes: Secondary | ICD-10-CM | POA: Diagnosis not present

## 2016-04-22 DIAGNOSIS — Z170 Estrogen receptor positive status [ER+]: Secondary | ICD-10-CM | POA: Diagnosis not present

## 2016-04-22 DIAGNOSIS — C50212 Malignant neoplasm of upper-inner quadrant of left female breast: Secondary | ICD-10-CM | POA: Diagnosis not present

## 2016-04-22 NOTE — Assessment & Plan Note (Signed)
Left breast biopsy 04/04/2016: 10:00 6 cmfn: IDC grade 3, ER 90%, PR 0%, Ki-67 50% HER-2 + ratio 3.16, copy #12.25; Biopsy 10:00, 8 CMFN: IDC grade 3 ER 90%, PR 0%, Ki-67 40%, HER-2 positive ratio 2.79, 12.75 average copy numbers percent; left axillary lymph node positive for metastatic cancer, ER 95%, PR 0%, Ki-67 40%, HER-2 positive ratio 3.16, average copy #12.25  Breast MRI 04/12/2016: Multifocal left breast cancer, 2 masses and an MB measures 4 cm; right breast oval enhancing nodule 7 mm, multiple left axillary lymph nodes largest 3.9 cm, T2 N1 stage IIB clinical stage.  Treatment plan: 1. Right breast biopsy 04/19/2016: Fibroadenoma with fibrocystic changes no malignancy 2. Bone scans: 04/20/2016 Normal CT chest abdomen pelvis 04/19/2016: Left breast 1.5 cm soft tissue lesion, left supraclavicular and left axillary lymph nodes Right breast 4 cm soft tissue lesion, 2.8 cm thyroid nodule, no metastatic disease in the abdomen or pelvis 4. Neoadjuvant chemotherapy with [facility] Perjeta (Dr. Luna Glasgow  With [facility] [place]) 5. Followed by Mastectomy + AXLND 6. Followed by adjuvant radiation 7. Followed by adjuvant antiestrogen therapy.

## 2016-04-22 NOTE — Progress Notes (Signed)
No care team member to display  SUMMARY OF ONCOLOGIC HISTORY:   Breast cancer of upper-inner quadrant of left female breast ([place])   03/14/2016 Mammogram    Left breast mammogram and ultrasound revealed 2 masses at 10:00 position 16 mm, 12 mm in multiple enlarged axillary lymph nodes with cortical thickening, T1cN1 (Stage 2A clinical stage)     04/04/2016 Initial Diagnosis    Left breast bx 10:00 6 cmfn: IDC grade 3, ER 90%, PR 0%, Ki-67 50% HER-2 + ratio 3.16, copy #12.25; Biopsy 10:00, 8 CMFN: IDC grade 3 ER 90%, PR 0%, Ki-67 40%, HER-2 pos ratio 2.79, 12.75 copy#; left ax LN: Br ca ER 95% PR 0%, Ki-67 40%, HER-2 Pos 3.16     04/12/2016 Breast MRI    Multifocal left breast cancer, 2 masses and  non-mass enhancement measures 4 cm; right breast oval enhancing nodule 7 mm, multiple left axillary lymph nodes largest 3.9 cm, T2 N1 stage IIB clinical stage      CHIEF COMPLIANT: Follow-up to review the scans  INTERVAL HISTORY: Marie Chang is a 55 year old with above-mentioned history of left breast cancer with supraclavicular and left axillary lymph nodes who underwent CT chest abdomen pelvis and bone scan is here to discuss the results. She is planning to meet with her medical oncologist in [place] next week. She is very anxious today. Port has been placed. It appears to be healing quite well. She will be meeting with Dr. Donne Hazel next week as well.  REVIEW OF SYSTEMS:   Constitutional: Denies fevers, chills or abnormal weight loss Eyes: Denies blurriness of vision Ears, nose, mouth, throat, and face: Denies mucositis or sore throat Respiratory: Denies cough, dyspnea or wheezes Cardiovascular: Denies palpitation, chest discomfort Gastrointestinal:  Denies nausea, heartburn or change in bowel habits Skin: Denies abnormal skin rashes Lymphatics: Denies new lymphadenopathy or easy bruising Neurological:Denies numbness, tingling or new weaknesses Behavioral/Psych: Anxious  Extremities: No  lower extremity edema  All other systems were reviewed with the patient and are negative.  I have reviewed the past medical history, past surgical history, social history and family history with the patient and they are unchanged from previous note.  ALLERGIES:  is allergic to abilify [aripiprazole] and codeine.  MEDICATIONS:  Current Outpatient Prescriptions  Medication Sig Dispense Refill  . Albuterol Sulfate 108 (90 Base) MCG/ACT AEPB Inhale 2 puffs into the lungs as needed.    . divalproex (DEPAKOTE ER) 500 MG 24 hr tablet Take 500 mg by mouth daily.  5  . divalproex (DEPAKOTE) 250 MG DR tablet TAKE 1 TAB BY MOUTH DAILY WITH THE '500MG'$  TABLET, AND TAKE WITH FOOD  1  . sertraline (ZOLOFT) 100 MG tablet Take 100 mg by mouth daily.  1   No current facility-administered medications for this visit.     PHYSICAL EXAMINATION: ECOG PERFORMANCE STATUS: 0 - Asymptomatic  Vitals:   04/22/16 1228  BP: 106/71  Pulse: 91  Resp: 17  Temp: 98.5 F (36.9 C)   Filed Weights   04/22/16 1228  Weight: 134 lb 11.2 oz (61.1 kg)    GENERAL:alert, no distress and comfortable SKIN: skin color, texture, turgor are normal, no rashes or significant lesions EYES: normal, Conjunctiva are pink and non-injected, sclera clear OROPHARYNX:no exudate, no erythema and lips, buccal mucosa, and tongue normal  NECK: supple, thyroid normal size, non-tender, without nodularity LYMPH:  no palpable lymphadenopathy in the cervical, axillary or inguinal LUNGS: clear to auscultation and percussion with normal breathing effort HEART: regular  rate & rhythm and no murmurs and no lower extremity edema ABDOMEN:abdomen soft, non-tender and normal bowel sounds MUSCULOSKELETAL:no cyanosis of digits and no clubbing  NEURO: alert & oriented x 3 with fluent speech, no focal motor/sensory deficits EXTREMITIES: No lower extremity edema  LABORATORY DATA:  I have reviewed the data as listed   Chemistry      Component Value  Date/Time   NA 138 04/18/2016 1210   K 4.0 04/18/2016 1210   CL 104 04/18/2016 1210   CO2 27 04/18/2016 1210   BUN 13 04/18/2016 1210   CREATININE 0.88 04/18/2016 1210      Component Value Date/Time   CALCIUM 8.8 (L) 04/18/2016 1210       Lab Results  Component Value Date   WBC 9.6 04/18/2016   HGB 15.0 04/18/2016   HCT 46.4 (H) 04/18/2016   MCV 93.5 04/18/2016   PLT 275 04/18/2016     ASSESSMENT & PLAN:  Breast cancer of upper-inner quadrant of left female breast ([facility]) Left breast biopsy 04/04/2016: 10:00 6 cmfn: IDC grade 3, ER 90%, PR 0%, Ki-67 50% HER-2 + ratio 3.16, copy #12.25; Biopsy 10:00, 8 CMFN: IDC grade 3 ER 90%, PR 0%, Ki-67 40%, HER-2 positive ratio 2.79, 12.75 average copy numbers percent; left axillary lymph node positive for metastatic cancer, ER 95%, PR 0%, Ki-67 40%, HER-2 positive ratio 3.16, average copy #12.25  Breast MRI 04/12/2016: Multifocal left breast cancer, 2 masses and an MB measures 4 cm; right breast oval enhancing nodule 7 mm, multiple left axillary lymph nodes largest 3.9 cm, T2 N1 stage IIB clinical stage.  Treatment plan: 1. Right breast biopsy 04/19/2016: Fibroadenoma with fibrocystic changes no malignancy 2. Bone scans: 04/20/2016 Normal CT chest abdomen pelvis 04/19/2016: Left breast 1.5 cm soft tissue lesion, left supraclavicular and left axillary lymph nodes Right breast 4 cm soft tissue lesion, 2.8 cm thyroid nodule, no metastatic disease in the abdomen or pelvis 4. Neoadjuvant chemotherapy with [facility] Perjeta (Dr. Luna Glasgow  With [facility]) 5. Followed by Mastectomy + AXLND 6. Followed by adjuvant radiation 7. Followed by adjuvant antiestrogen therapy.  No orders of the defined types were placed in this encounter.  The patient has a good understanding of the overall plan. she agrees with it. she will call with any problems that may develop before the next visit here.   Rulon Eisenmenger,  MD 04/22/16

## 2016-06-28 ENCOUNTER — Telehealth: Payer: Self-pay | Admitting: *Deleted

## 2016-06-28 NOTE — Telephone Encounter (Signed)
Received call from [place] office in [place] looking for bx report from 04/04/16.  Message taken & notified HIM/Andrea to send report & informed that pt is at the office there now.

## 2016-07-12 ENCOUNTER — Telehealth: Payer: Self-pay | Admitting: Hematology and Oncology

## 2016-07-12 NOTE — Telephone Encounter (Signed)
FAXED RECORDS TO DR Bobbe Medico [phone] RELEASE ID [id number]

## 2016-09-02 ENCOUNTER — Telehealth: Payer: Self-pay

## 2016-09-02 NOTE — Telephone Encounter (Signed)
Pt called to get Dr.Gudena's opinion regarding taking perjeta and herceptin for a year, as treatment plan set by her oncologist at [place]. Pt stated that per her last visit with Dr.Gudena. Her tx plan was to receive herceptin only for a yr after surgery. Discussed with Dr.Gudena and states that as of this year, newer studies have shown increased benefits in survival with pts taking herceptin/perjeta for a year than herceptin alone. No increased risk in heart issues, per Dr.Gudena, with taking the treatment concurrently for a year. Relayed msg to pt and appreciative of information and advise to make a sound decision. Told pt to discuss this with her oncologist and to ask questions regarding side effects from taking herceptin/perjeta tx for a period of time. Pt satisfied with call and has no further questions at this time.

## 2017-12-21 IMAGING — US IR US GUIDE VASC ACCESS RIGHT
1 series · 2 of 2 positions shown · non-contrast
Comparison: none

CLINICAL DATA: BREAST CANCER, ACCESS CHEMOTHERAPY

[Series 1: ir fluoro/shunt/fist · 2 of 2 slices shown]
[im 1/2]
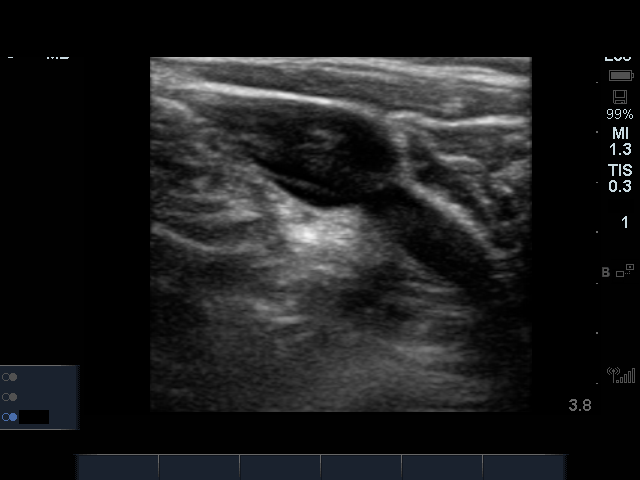
[im 2/2]
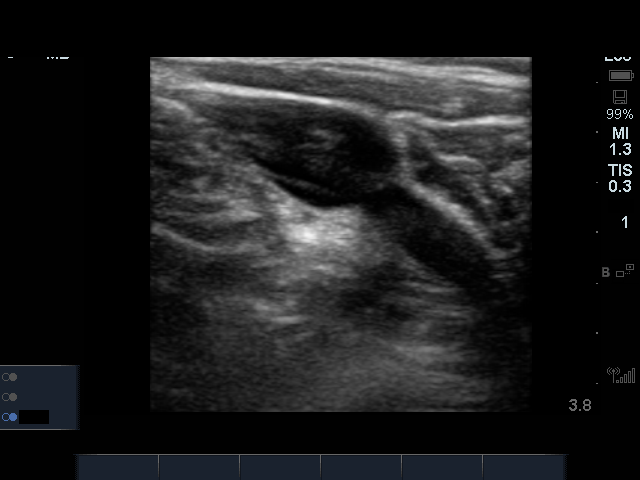

[2 of 2 positions shown; findings below may reference images not displayed]

EXAM:
RIGHT INTERNAL JUGULAR SINGLE LUMEN POWER PORT CATHETER INSERTION

Date:  [DATE] [DATE]

Radiologist:  Gigic, Maljevik

Guidance:  Ultrasound and fluoroscopic

MEDICATIONS:
2 g and Lentletse; The antibiotic was administered within an appropriate
time interval prior to skin puncture.

ANESTHESIA/SEDATION:
Versed 2 mg IV; Fentanyl 100 mcg IV;

Moderate Sedation Time:  30

The patient was continuously monitored during the procedure by the
interventional radiology nurse under my direct supervision.

FLUOROSCOPY TIME:  24 seconds (1.1 mGy)

COMPLICATIONS:
None immediate.

CONTRAST:  None.

PROCEDURE:
Informed consent was obtained from the patient following explanation
of the procedure, risks, benefits and alternatives. The patient
understands, agrees and consents for the procedure. All questions
were addressed. A time out was performed.

Maximal barrier sterile technique utilized including caps, mask,
sterile gowns, sterile gloves, large sterile drape, hand hygiene,
and 2% chlorhexidine scrub.

Under sterile conditions and local anesthesia, right internal
jugular micropuncture venous access was performed. Access was
performed with ultrasound. Images were obtained for documentation. A
guide wire was inserted followed by a transitional dilator. This
allowed insertion of a guide wire and catheter into the IVC.
Measurements were obtained from the SVC / RA junction back to the
right IJ venotomy site. In the right infraclavicular chest, a
subcutaneous pocket was created over the second anterior rib. This
was done under sterile conditions and local anesthesia. 1% lidocaine
with epinephrine was utilized for this. A 2.5 cm incision was made
in the skin. Blunt dissection was performed to create a subcutaneous
pocket over the right pectoralis major muscle. The pocket was
flushed with saline vigorously. There was adequate hemostasis. The
port catheter was assembled and checked for leakage. The port
catheter was secured in the pocket with two retention sutures. The
tubing was tunneled subcutaneously to the right venotomy site and
inserted into the SVC/RA junction through a valved peel-away sheath.
Position was confirmed with fluoroscopy. Images were obtained for
documentation. The patient tolerated the procedure well. No
immediate complications. Incisions were closed in a two layer
fashion with 4 - 0 Vicryl suture. Dermabond was applied to the skin.
The port catheter was accessed, blood was aspirated followed by
saline and heparin flushes. Needle was removed. A dry sterile
dressing was applied.
IMPRESSION: Ultrasound and fluoroscopically guided right internal jugular single
lumen power port catheter insertion. Tip in the SVC/RA junction.
Catheter ready for use.
